# Patient Record
Sex: Female | Born: 1950 | Race: Black or African American | Hispanic: No | Marital: Single | Smoking: Never smoker
Health system: Southern US, Community
[De-identification: ages and names within clinical notes are randomized; demographics above are authoritative.]

## PROBLEM LIST (undated history)

## (undated) DIAGNOSIS — C801 Malignant (primary) neoplasm, unspecified: Secondary | ICD-10-CM

## (undated) DIAGNOSIS — I10 Essential (primary) hypertension: Secondary | ICD-10-CM

## (undated) DIAGNOSIS — E119 Type 2 diabetes mellitus without complications: Secondary | ICD-10-CM

## (undated) DIAGNOSIS — E785 Hyperlipidemia, unspecified: Secondary | ICD-10-CM

## (undated) DIAGNOSIS — E113599 Type 2 diabetes mellitus with proliferative diabetic retinopathy without macular edema, unspecified eye: Secondary | ICD-10-CM

## (undated) HISTORY — PX: ABDOMINAL HYSTERECTOMY: SHX81

## (undated) HISTORY — PX: JOINT REPLACEMENT: SHX530

## (undated) HISTORY — PX: EYE SURGERY: SHX253

## (undated) HISTORY — PX: BREAST SURGERY: SHX581

---

## 2016-01-20 ENCOUNTER — Ambulatory Visit (INDEPENDENT_AMBULATORY_CARE_PROVIDER_SITE_OTHER): Payer: Worker's Compensation

## 2016-01-20 ENCOUNTER — Encounter: Payer: Self-pay | Admitting: *Deleted

## 2016-01-20 ENCOUNTER — Ambulatory Visit
Admission: EM | Admit: 2016-01-20 | Discharge: 2016-01-20 | Disposition: A | Discharge status: Home / Self Care | Payer: Worker's Compensation | Attending: Family Medicine | Admitting: Family Medicine

## 2016-01-20 DIAGNOSIS — S8002XA Contusion of left knee, initial encounter: Secondary | ICD-10-CM

## 2016-01-20 DIAGNOSIS — S80812A Abrasion, left lower leg, initial encounter: Secondary | ICD-10-CM

## 2016-01-20 HISTORY — DX: Type 2 diabetes mellitus without complications: E11.9

## 2016-01-20 HISTORY — DX: Hyperlipidemia, unspecified: E78.5

## 2016-01-20 HISTORY — DX: Essential (primary) hypertension: I10

## 2016-01-20 HISTORY — DX: Type 2 diabetes mellitus with proliferative diabetic retinopathy without macular edema, unspecified eye: E11.3599

## 2016-01-20 HISTORY — DX: Malignant (primary) neoplasm, unspecified: C80.1

## 2016-01-20 NOTE — ED Notes (Signed)
Patient tripped over a pallet at work and fell damaging her left lower leg. Skin scrape is visible.

## 2016-01-20 NOTE — ED Provider Notes (Signed)
CSN: [id number]     Arrival date & time 01/20/16  1104 History   First MD Initiated Contact with Patient 01/20/16 1148     Chief Complaint  Patient presents with  . Leg Injury  . Work Related Injury   (Consider location/radiation/quality/duration/timing/severity/associated sxs/prior Treatment) HPI Comments: 65 yo female presents with a c/o left lower leg (knee and upper shin) pain after tripping over a pallet at work and landing on her left knee.   The history is provided by the patient.    Past Medical History  Diagnosis Date  . Hypertension   . Diabetes mellitus without complication ([place])   . Cancer ([place])   . Hyperlipemia   . PDR (proliferative diabetic retinopathy) [facility])    Past Surgical History  Procedure Laterality Date  . Abdominal hysterectomy    . Breast surgery    . Eye surgery    . Joint replacement     History reviewed. No pertinent family history. Social History  Substance Use Topics  . Smoking status: Never Smoker   . Smokeless tobacco: Never Used  . Alcohol Use: No   OB History    No data available     Review of Systems  Allergies  Nitrofuran derivatives and Ciprofloxacin  Home Medications   Prior to Admission medications   Medication Sig Start Date End Date Taking? Authorizing Provider  alclomethasone (ACLOVATE) 0.05 % cream Apply 1 application topically 2 (two) times daily.   Yes Historical Provider, MD  aspirin 81 MG tablet Take 81 mg by mouth daily.   Yes Historical Provider, MD  benazepril (LOTENSIN) 20 MG tablet Take 20 mg by mouth daily.   Yes Historical Provider, MD  clobetasol cream (TEMOVATE) [id number] % Apply 1 application topically 2 (two) times daily.   Yes Historical Provider, MD  fluocinolone (SYNALAR) 0.025 % ointment Apply 1 application topically 2 (two) times daily.   Yes Historical Provider, MD  hydroquinone 4 % cream Apply 1 application topically 2 (two) times daily.   Yes Historical Provider, MD  insulin glargine (LANTUS) 100 UNIT/ML  injection Inject 13 Units into the skin daily.   Yes Historical Provider, MD  insulin lispro (HUMALOG KWIKPEN) 100 UNIT/ML KiwkPen Inject into the skin.   Yes Historical Provider, MD  metFORMIN (GLUCOPHAGE) 850 MG tablet Take 850 mg by mouth 2 (two) times daily with a meal.   Yes Historical Provider, MD  Multiple Vitamin (MULTIVITAMIN) capsule Take 1 capsule by mouth daily.   Yes Historical Provider, MD  simvastatin (ZOCOR) 40 MG tablet Take 40 mg by mouth daily.   Yes Historical Provider, MD  triamcinolone cream (KENALOG) 0.5 % Apply 1 application topically 3 (three) times daily.   Yes Historical Provider, MD  valACYclovir (VALTREX) 500 MG tablet Take 500 mg by mouth 2 (two) times daily.   Yes Historical Provider, MD  glucagon (GLUCAGON EMERGENCY) 1 MG injection Inject 1 mg into the vein once as needed.    Historical Provider, MD  meclizine (ANTIVERT) 25 MG tablet Take 25 mg by mouth 3 (three) times daily as needed for dizziness.    Historical Provider, MD   Meds Ordered and Administered this Visit  Medications - No data to display  BP 136/62 mmHg  Pulse 71  Temp(Src) 97.5 F (36.4 C) (Oral)  Resp 18  Ht 5\' 10"  (1.778 m)  Wt 181 lb (82.101 kg)  BMI 25.97 kg/m2  SpO2 100% No data found.   Physical Exam  Constitutional: She appears well-developed and well-nourished.  No distress.  Musculoskeletal:       Left knee: She exhibits swelling and bony tenderness. She exhibits no effusion, no ecchymosis, no erythema, normal alignment, no LCL laxity and normal patellar mobility. Lacerations: skin tear, abrasion. Tenderness found.       Legs: Skin: She is not diaphoretic.  Nursing note and vitals reviewed.   ED Course  Procedures (including critical care time)  Labs Review Labs Reviewed - No data to display  Imaging Review Dg Knee Complete 4 Views Left  01/20/2016  CLINICAL DATA:  Tripped at work.  Pain below patella EXAM: LEFT KNEE - COMPLETE 4+ VIEW COMPARISON:  None. FINDINGS: Mild  spurring in the medial and patellofemoral compartments. No acute bony abnormality. Specifically, no fracture, subluxation, or dislocation. Soft tissues are intact. No joint effusion. IMPRESSION: Mild degenerative changes.  No acute findings. Electronically Signed   By: Rolm Baptise M.D.   On: 01/20/2016 12:34     Visual Acuity Review  Right Eye Distance:   Left Eye Distance:   Bilateral Distance:    Right Eye Near:   Left Eye Near:    Bilateral Near:         MDM   1. Knee contusion, left, initial encounter   2. Abrasion of lower extremity, left, initial encounter    Discharge Medication List as of 01/20/2016  1:17 PM     1. x-ray results and diagnosis reviewed with patient 2. Recommend supportive treatment with otc analgesics prn, rest, ice, elevation 3. Wound cleaned and dressed; wound care instructions for abrasion given 4. Follow-up prn 5. May return to work for next scheduled work day (in 2 days per patient)  Norval Gable, MD 01/20/16 [phone]

## 2017-07-27 IMAGING — CR DG KNEE COMPLETE 4+V*L*
4 series · 4 of 4 positions shown · non-contrast
Comparison: None.

CLINICAL DATA: Tripped at work.  Pain below patella

EXAM:
LEFT KNEE - COMPLETE 4+ VIEW

[knee ap]
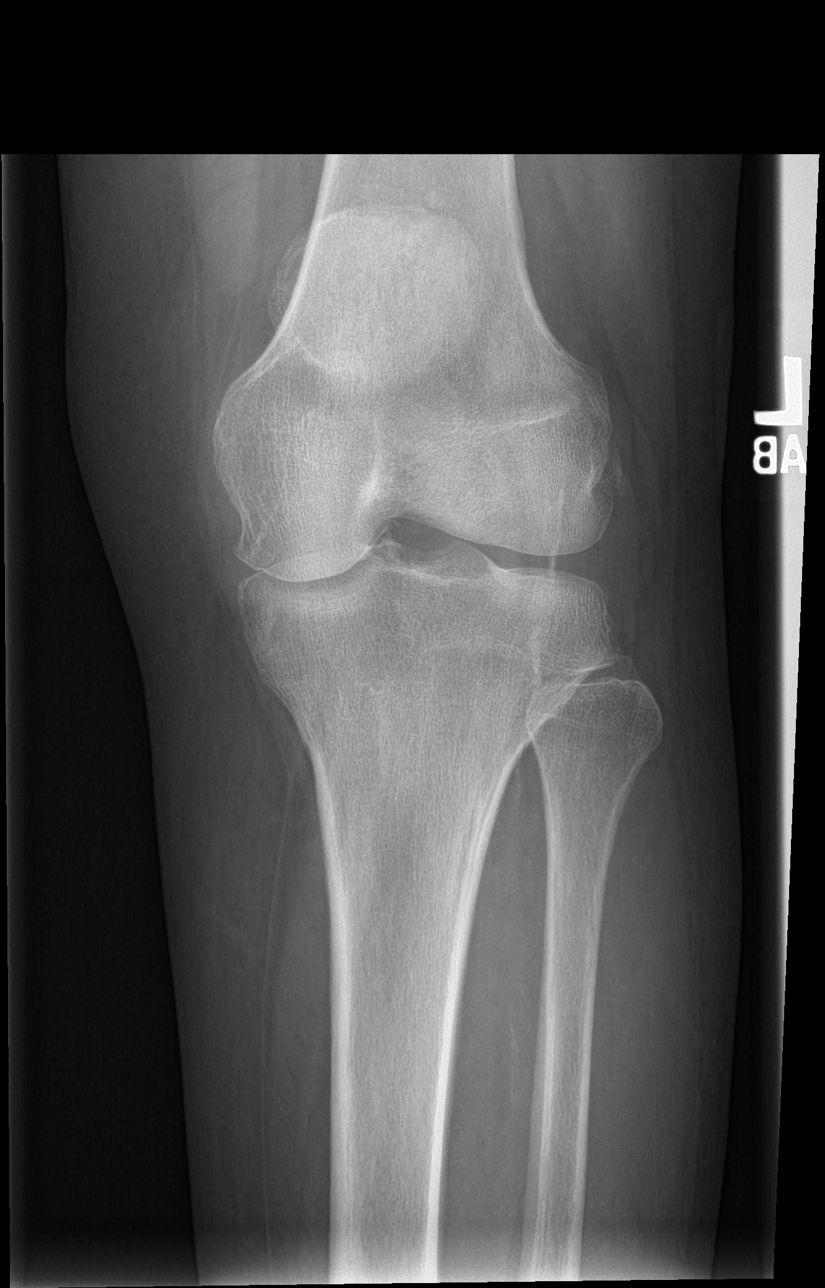

[tunnel]
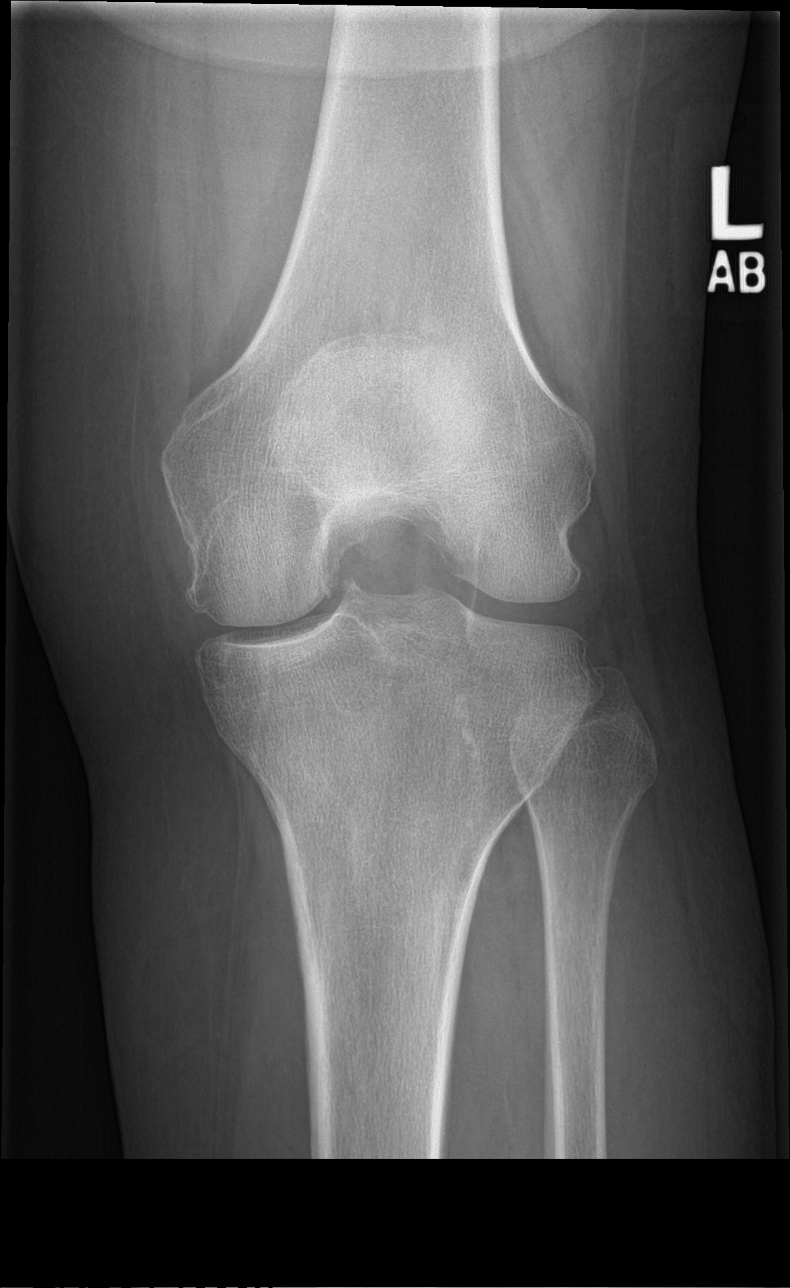

[knee lat]
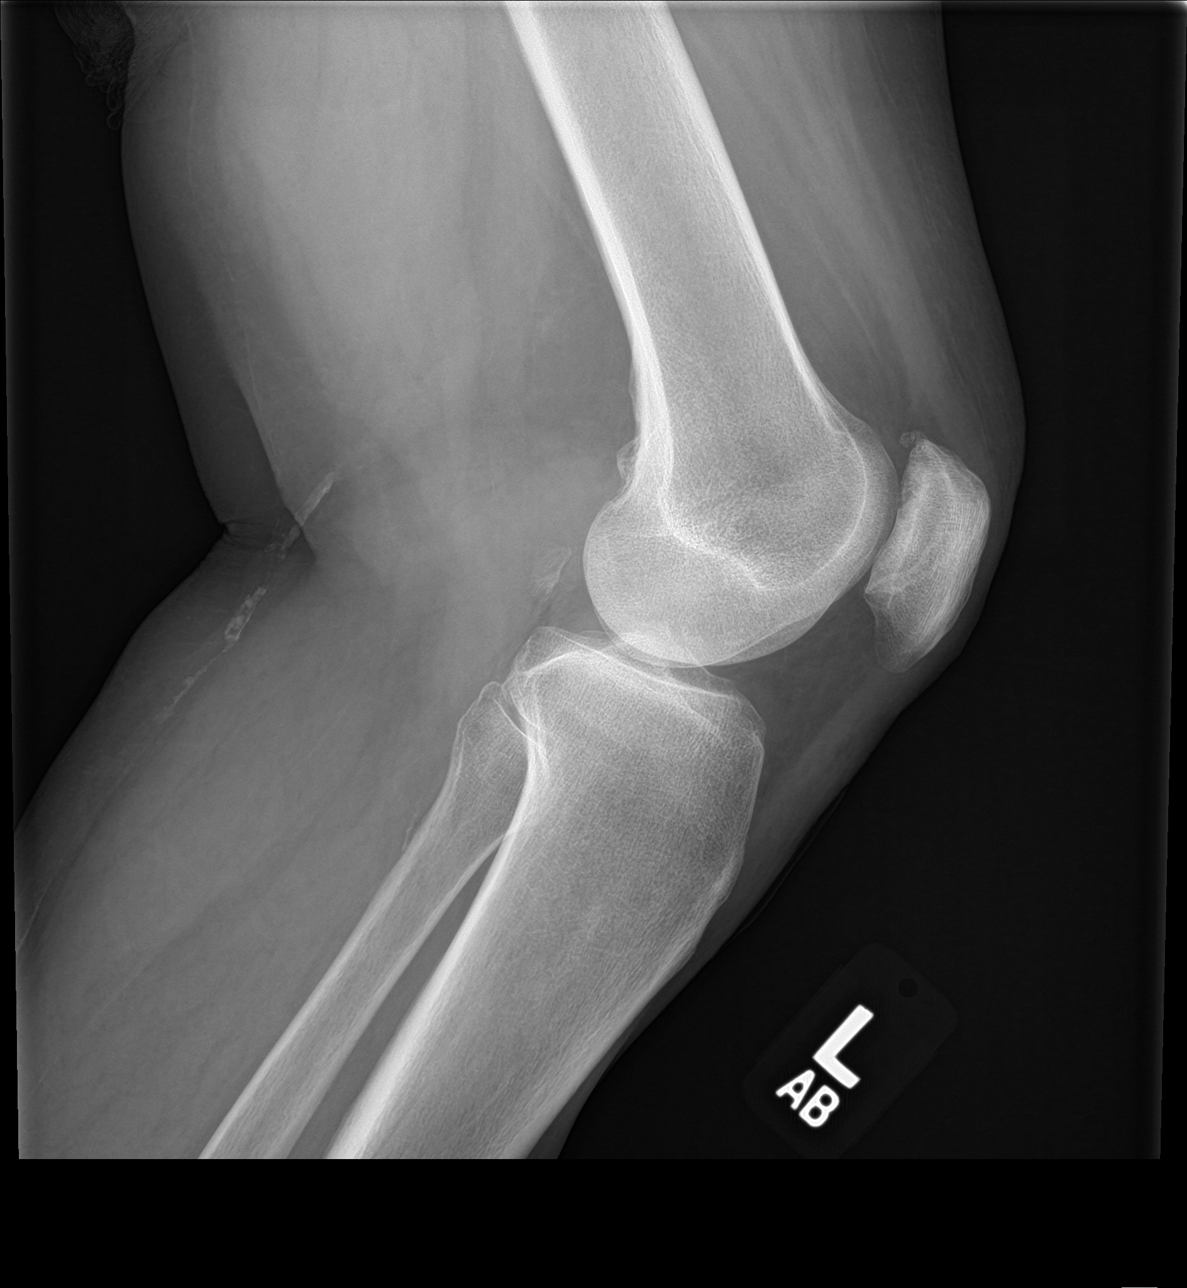

[patella skyline]
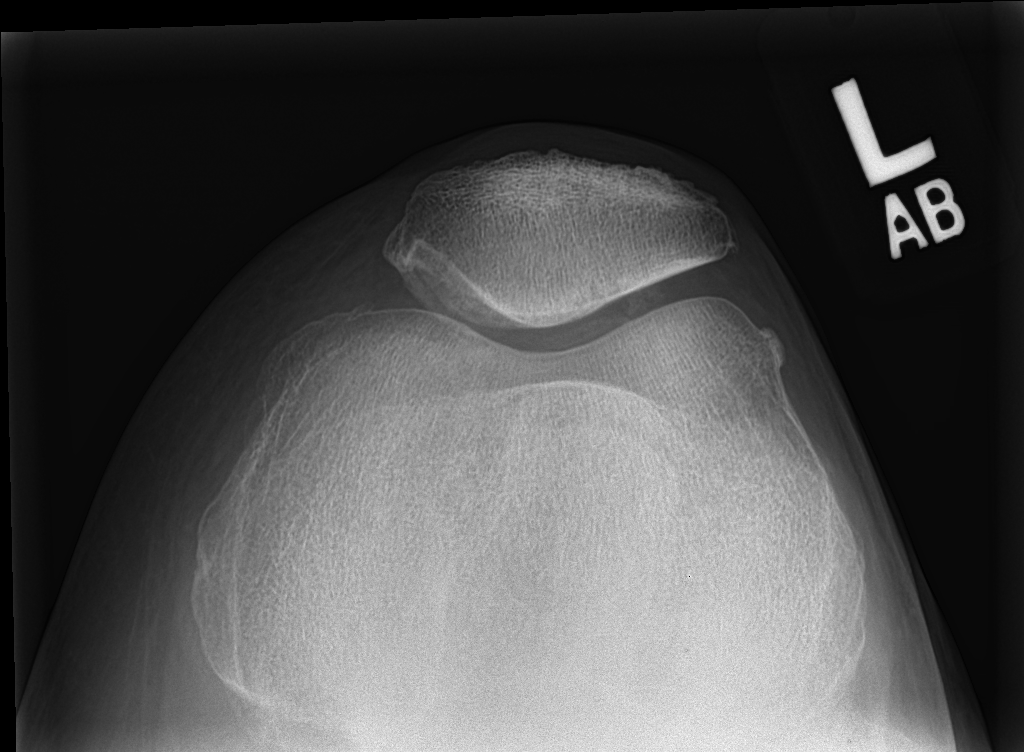

[4 of 4 positions shown; findings below may reference images not displayed]

FINDINGS: Mild spurring in the medial and patellofemoral compartments. No
acute bony abnormality. Specifically, no fracture, subluxation, or
dislocation. Soft tissues are intact. No joint effusion.
IMPRESSION: Mild degenerative changes.  No acute findings.
